# Patient Record
Sex: Male | Born: 1955 | Race: White | Hispanic: No | Marital: Married | Smoking: Never smoker
Health system: Southern US, Community
[De-identification: ages and names within clinical notes are randomized; demographics above are authoritative.]

## PROBLEM LIST (undated history)

## (undated) DIAGNOSIS — I10 Essential (primary) hypertension: Secondary | ICD-10-CM

## (undated) HISTORY — PX: EYE SURGERY: SHX253

## (undated) HISTORY — PX: CARDIAC SURGERY: SHX584

## (undated) HISTORY — PX: HERNIA REPAIR: SHX51

## (undated) HISTORY — PX: KNEE SURGERY: SHX244

---

## 2009-07-29 ENCOUNTER — Ambulatory Visit (HOSPITAL_COMMUNITY): Admission: RE | Admit: 2009-07-29 | Discharge: 2009-07-30 | Payer: Self-pay | Admitting: Neurosurgery

## 2010-06-07 ENCOUNTER — Other Ambulatory Visit: Payer: Self-pay | Admitting: Neurosurgery

## 2010-06-07 DIAGNOSIS — M542 Cervicalgia: Secondary | ICD-10-CM

## 2010-06-10 ENCOUNTER — Ambulatory Visit
Admission: RE | Admit: 2010-06-10 | Discharge: 2010-06-10 | Disposition: A | Discharge status: Home / Self Care | Payer: Worker's Compensation | Source: Ambulatory Visit | Attending: Neurosurgery | Admitting: Neurosurgery

## 2010-06-10 DIAGNOSIS — M542 Cervicalgia: Secondary | ICD-10-CM

## 2010-06-21 LAB — CBC
HCT: 41.5 % (ref 39.0–52.0)
Hemoglobin: 14.6 g/dL (ref 13.0–17.0)
MCHC: 35 g/dL (ref 30.0–36.0)
MCV: 93.3 fL (ref 78.0–100.0)
Platelets: 260 10*3/uL (ref 150–400)
RBC: 4.45 MIL/uL (ref 4.22–5.81)
RDW: 12.6 % (ref 11.5–15.5)
WBC: 5.9 10*3/uL (ref 4.0–10.5)

## 2010-06-21 LAB — BASIC METABOLIC PANEL
BUN: 12 mg/dL (ref 6–23)
CO2: 31 mEq/L (ref 19–32)
Calcium: 9.4 mg/dL (ref 8.4–10.5)
Chloride: 105 mEq/L (ref 96–112)
Creatinine, Ser: 0.93 mg/dL (ref 0.4–1.5)
GFR calc Af Amer: >60 mL/min (ref >60)
GFR calc non Af Amer: >60 mL/min (ref >60)
Glucose, Bld: 90 mg/dL (ref 70–99)
Potassium: 4.3 mEq/L (ref 3.5–5.1)
Sodium: 141 mEq/L (ref 135–145)

## 2010-06-21 LAB — SURGICAL PCR SCREEN
MRSA, PCR: NEGATIVE
Staphylococcus aureus: NEGATIVE

## 2010-08-10 ENCOUNTER — Ambulatory Visit (HOSPITAL_COMMUNITY)
Admission: RE | Admit: 2010-08-10 | Discharge: 2010-08-10 | Disposition: A | Discharge status: Home / Self Care | Payer: Worker's Compensation | Source: Ambulatory Visit | Attending: Neurosurgery | Admitting: Neurosurgery

## 2010-08-10 ENCOUNTER — Other Ambulatory Visit (HOSPITAL_COMMUNITY): Payer: Self-pay | Admitting: Neurosurgery

## 2010-08-10 ENCOUNTER — Encounter (HOSPITAL_COMMUNITY)
Admission: RE | Admit: 2010-08-10 | Discharge: 2010-08-10 | Disposition: A | Discharge status: Home / Self Care | Payer: Worker's Compensation | Source: Ambulatory Visit | Attending: Neurosurgery | Admitting: Neurosurgery

## 2010-08-10 DIAGNOSIS — Z01810 Encounter for preprocedural cardiovascular examination: Secondary | ICD-10-CM | POA: Insufficient documentation

## 2010-08-10 DIAGNOSIS — Z01812 Encounter for preprocedural laboratory examination: Secondary | ICD-10-CM | POA: Insufficient documentation

## 2010-08-10 DIAGNOSIS — Z01818 Encounter for other preprocedural examination: Secondary | ICD-10-CM | POA: Insufficient documentation

## 2010-08-10 DIAGNOSIS — M5412 Radiculopathy, cervical region: Secondary | ICD-10-CM

## 2010-08-10 DIAGNOSIS — I10 Essential (primary) hypertension: Secondary | ICD-10-CM | POA: Insufficient documentation

## 2010-08-10 LAB — CBC
HCT: 41 % (ref 39.0–52.0)
Hemoglobin: 14.4 g/dL (ref 13.0–17.0)
MCH: 30.1 pg (ref 26.0–34.0)
MCHC: 35.1 g/dL (ref 30.0–36.0)
MCV: 85.8 fL (ref 78.0–100.0)
Platelets: 261 10*3/uL (ref 150–400)
RBC: 4.78 MIL/uL (ref 4.22–5.81)
RDW: 12.2 % (ref 11.5–15.5)
WBC: 6.4 10*3/uL (ref 4.0–10.5)

## 2010-08-10 LAB — BASIC METABOLIC PANEL
BUN: 15 mg/dL (ref 6–23)
CO2: 29 mEq/L (ref 19–32)
Calcium: 10.1 mg/dL (ref 8.4–10.5)
Chloride: 102 mEq/L (ref 96–112)
Creatinine, Ser: 1.09 mg/dL (ref 0.4–1.5)
GFR calc Af Amer: >60 mL/min (ref >60)
GFR calc non Af Amer: >60 mL/min (ref >60)
Glucose, Bld: 91 mg/dL (ref 70–99)
Potassium: 5 mEq/L (ref 3.5–5.1)
Sodium: 138 mEq/L (ref 135–145)

## 2010-08-10 LAB — TYPE AND SCREEN
ABO/RH(D): A POS
Antibody Screen: NEGATIVE

## 2010-08-10 LAB — SURGICAL PCR SCREEN
MRSA, PCR: NEGATIVE
Staphylococcus aureus: NEGATIVE

## 2010-08-10 LAB — ABO/RH: ABO/RH(D): A POS

## 2010-08-17 ENCOUNTER — Inpatient Hospital Stay (HOSPITAL_COMMUNITY)
Admission: RE | Admit: 2010-08-17 | Discharge: 2010-08-19 | DRG: 909 | Disposition: A | Discharge status: Home / Self Care | Payer: Worker's Compensation | Source: Ambulatory Visit | Attending: Neurosurgery | Admitting: Neurosurgery

## 2010-08-17 ENCOUNTER — Inpatient Hospital Stay (HOSPITAL_COMMUNITY): Payer: Worker's Compensation

## 2010-08-17 DIAGNOSIS — Z981 Arthrodesis status: Secondary | ICD-10-CM

## 2010-08-17 DIAGNOSIS — IMO0002 Reserved for concepts with insufficient information to code with codable children: Principal | ICD-10-CM | POA: Diagnosis present

## 2010-08-17 DIAGNOSIS — N9989 Other postprocedural complications and disorders of genitourinary system: Secondary | ICD-10-CM | POA: Diagnosis not present

## 2010-08-17 DIAGNOSIS — R339 Retention of urine, unspecified: Secondary | ICD-10-CM | POA: Diagnosis not present

## 2010-08-17 DIAGNOSIS — Y838 Other surgical procedures as the cause of abnormal reaction of the patient, or of later complication, without mention of misadventure at the time of the procedure: Secondary | ICD-10-CM | POA: Diagnosis present

## 2010-08-18 LAB — URINALYSIS, MICROSCOPIC ONLY
Bilirubin Urine: NEGATIVE
Glucose, UA: NEGATIVE mg/dL
Hgb urine dipstick: NEGATIVE
Ketones, ur: NEGATIVE mg/dL
Leukocytes, UA: NEGATIVE
Nitrite: NEGATIVE
Protein, ur: NEGATIVE mg/dL
Specific Gravity, Urine: 1.008 (ref 1.005–1.030)
Urobilinogen, UA: 0.2 mg/dL (ref 0.0–1.0)
pH: 6.5 (ref 5.0–8.0)

## 2010-08-19 LAB — URINE CULTURE
Colony Count: NO GROWTH
Culture  Setup Time: 201205171545
Culture: NO GROWTH

## 2010-08-19 NOTE — Op Note (Signed)
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  PROCEDURE:  C5-C7 posterior cervical arthrodesis with Viewpoint lateral mass screws and rod and Vitoss and Infuse.  SURGEON:  Hewitt Shorts, MD  ASSISTANT:  Cristi Loron, MD  ANESTHESIA:  General endotracheal.  INDICATIONS:  The patient is a 55 year old man who is 1 year status post a two-level C5-6 and C6-7 ACDF.  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  He was treated with external bone growth stimulator and Miacalcin nasal spray without evidence of healing, and therefore, decision was made to proceed with posterior cervical arthrodesis.  PROCEDURE:  The patient was brought to the operating room, placed under general endotracheal anesthesia.  The patient was placed in 3-pin [place] head holder and turned to prone position.  The neck and upper back were prepped with Betadine and soap solution and draped in a sterile fashion.  Midline incision was made over the mid to lower cervical spine.  The line of incision was infiltrated with local anesthetic with epinephrine.  Dissection was carried down to the cervical fascia which was incised bilaterally and the paracervical musculature was dissected from the spinous process and lamina in a subperiosteal fashion.  An x-ray was taken.  We localized the C5, C6, and C7 spinous process and lamina and because his shoulders were not feasible to use C-arm fluoroscopy for visualization and therefore,  screw placement was done under direct visualization.  Entry points were identified on the lateral masses bilaterally at each level.  A pilot hole was made at each location and then a drill hole was made in each lateral mass with  inferomedial to the superolateral trajectory.  Each was examined the ball probe, good bony surfaces were noted and then the posterior cortex was tapped and then we placed a 3.5-mm screws bilaterally at each level using 14-mm screws bilaterally at C5.  A 14-mm screw on the right and a 12-mm screw on the left at C6 and a 14-mm screws bilaterally at C7.  Once all six screws were in place, the rod length was measured.  We wanted a 45-mm length rod.  We cut a 120-mm rods down to 45-mm rods.  They were gently lordosed with a [place] bender and then the rods were placed within the screw heads.  Locking caps were placed and then final tightening was done of all six locking caps against a counter torque.  We decorticated the lamina and spinous process of C5, C6, and C7.  We laid a small layer of Infuse and then a Vitoss over the spinous process and lamina and was compressed against the bony surface and then we proceeded with closure.  Deep fascia was approximated  with interrupted undyed 0 Vicryl sutures.  Scarpa fascia was closed with interrupted undyed 0 Vicryl sutures.  Subcutaneous and subcuticular were closed with inverted 2-0 Vicryl suture, and the skin was approximated with Dermabond.  The procedure was tolerated well.  Estimated blood loss was 50 mL.  Sponge count correct. Following surgery, the patient was turned back to supine position and a 3-pin [place] head holder was removed.  He is to be reversed from the anesthetic, extubated, and transferred to recovery room for further care.  He is to be placed in a soft cervical collar.     Hewitt Shorts, M.D.     RWN/MEDQ  D:  08/17/2010  T:  08/18/2010  Job:  [id number]  Electronically Signed by Shirlean Kelly M.D. on 08/19/2010 11:09:41 AM

## 2011-06-02 ENCOUNTER — Other Ambulatory Visit: Payer: Self-pay | Admitting: Neurosurgery

## 2011-06-02 DIAGNOSIS — M542 Cervicalgia: Secondary | ICD-10-CM

## 2011-06-09 ENCOUNTER — Ambulatory Visit
Admission: RE | Admit: 2011-06-09 | Discharge: 2011-06-09 | Disposition: A | Discharge status: Home / Self Care | Payer: Worker's Compensation | Source: Ambulatory Visit | Attending: Neurosurgery | Admitting: Neurosurgery

## 2011-06-09 ENCOUNTER — Other Ambulatory Visit: Payer: Self-pay

## 2011-06-09 DIAGNOSIS — M542 Cervicalgia: Secondary | ICD-10-CM

## 2012-05-11 IMAGING — CR DG CHEST 2V
2 series · 2 of 2 positions shown · non-contrast
Comparison: 07/23/2009 radiograph and 06/10/2010 cervical spine CT

CLINICAL DATA: Cervical radiculopathy hypertension, cardiac
disease, nonsmoker, preop

CHEST - 2 VIEW

[view not recorded (1 of 2)]
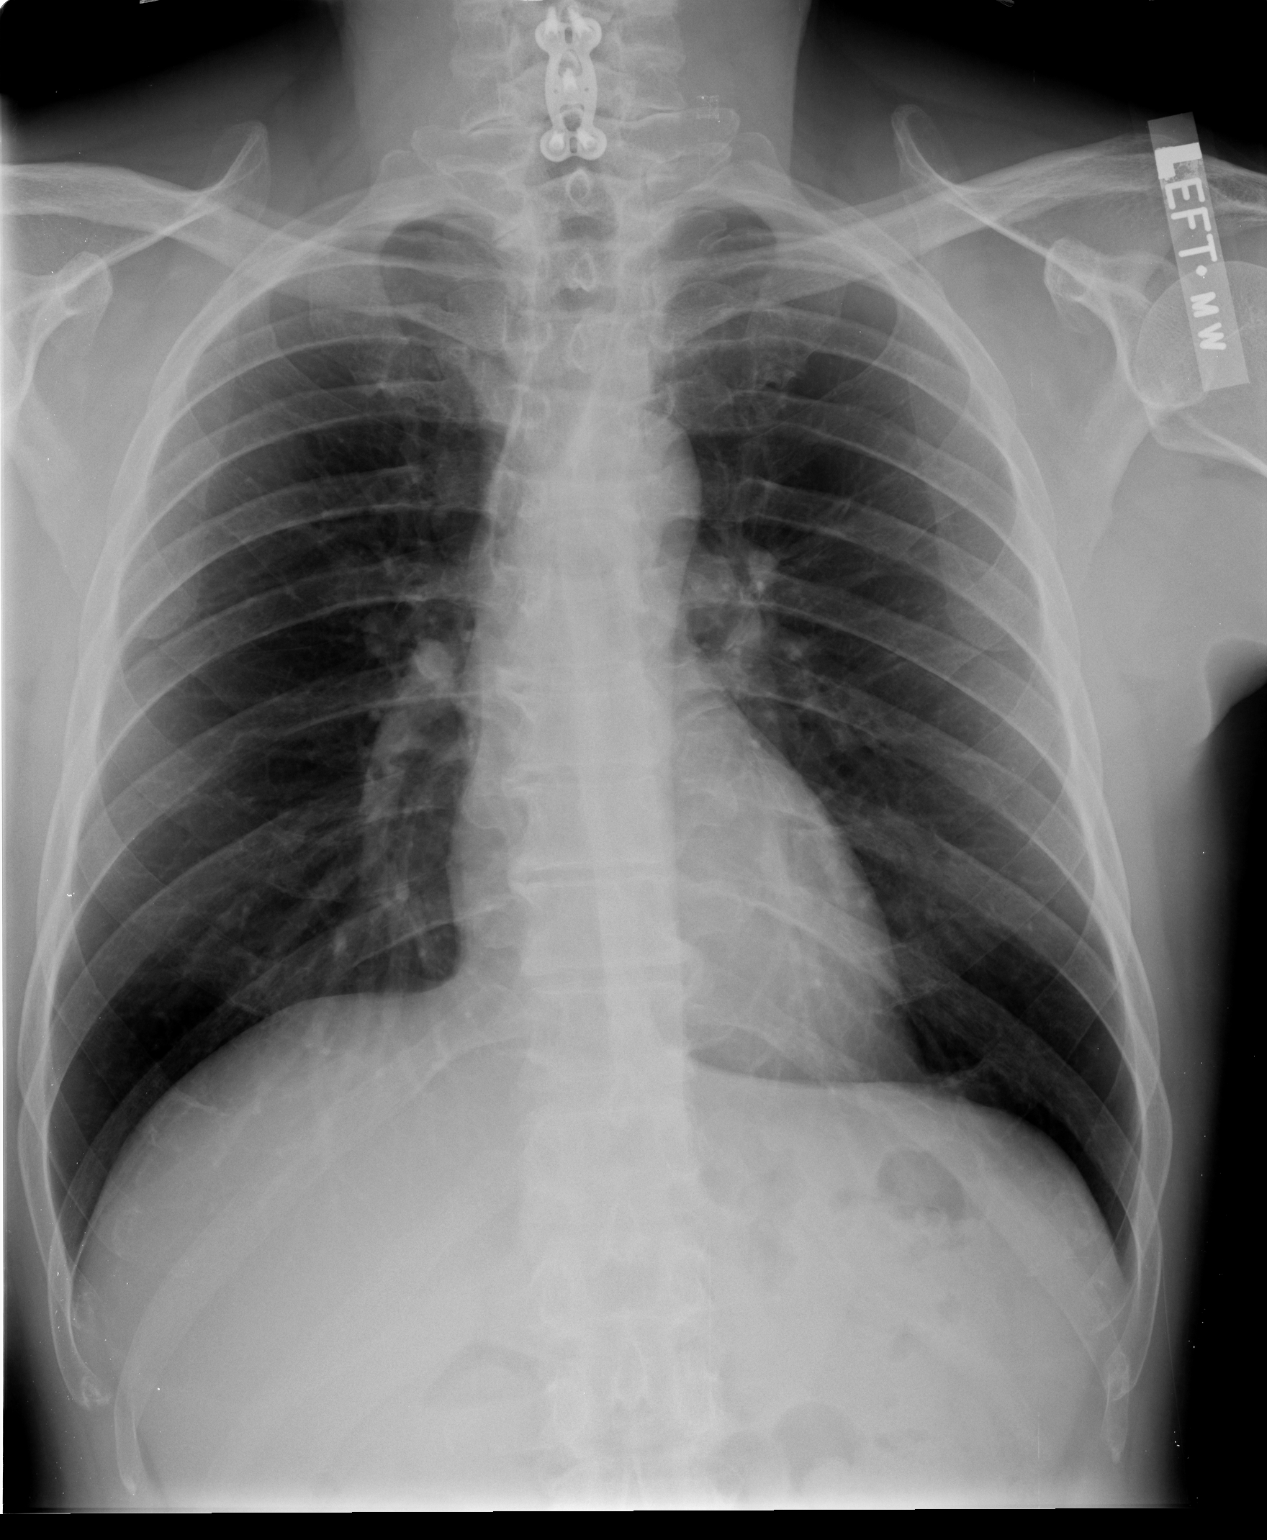

[view not recorded (2 of 2)]
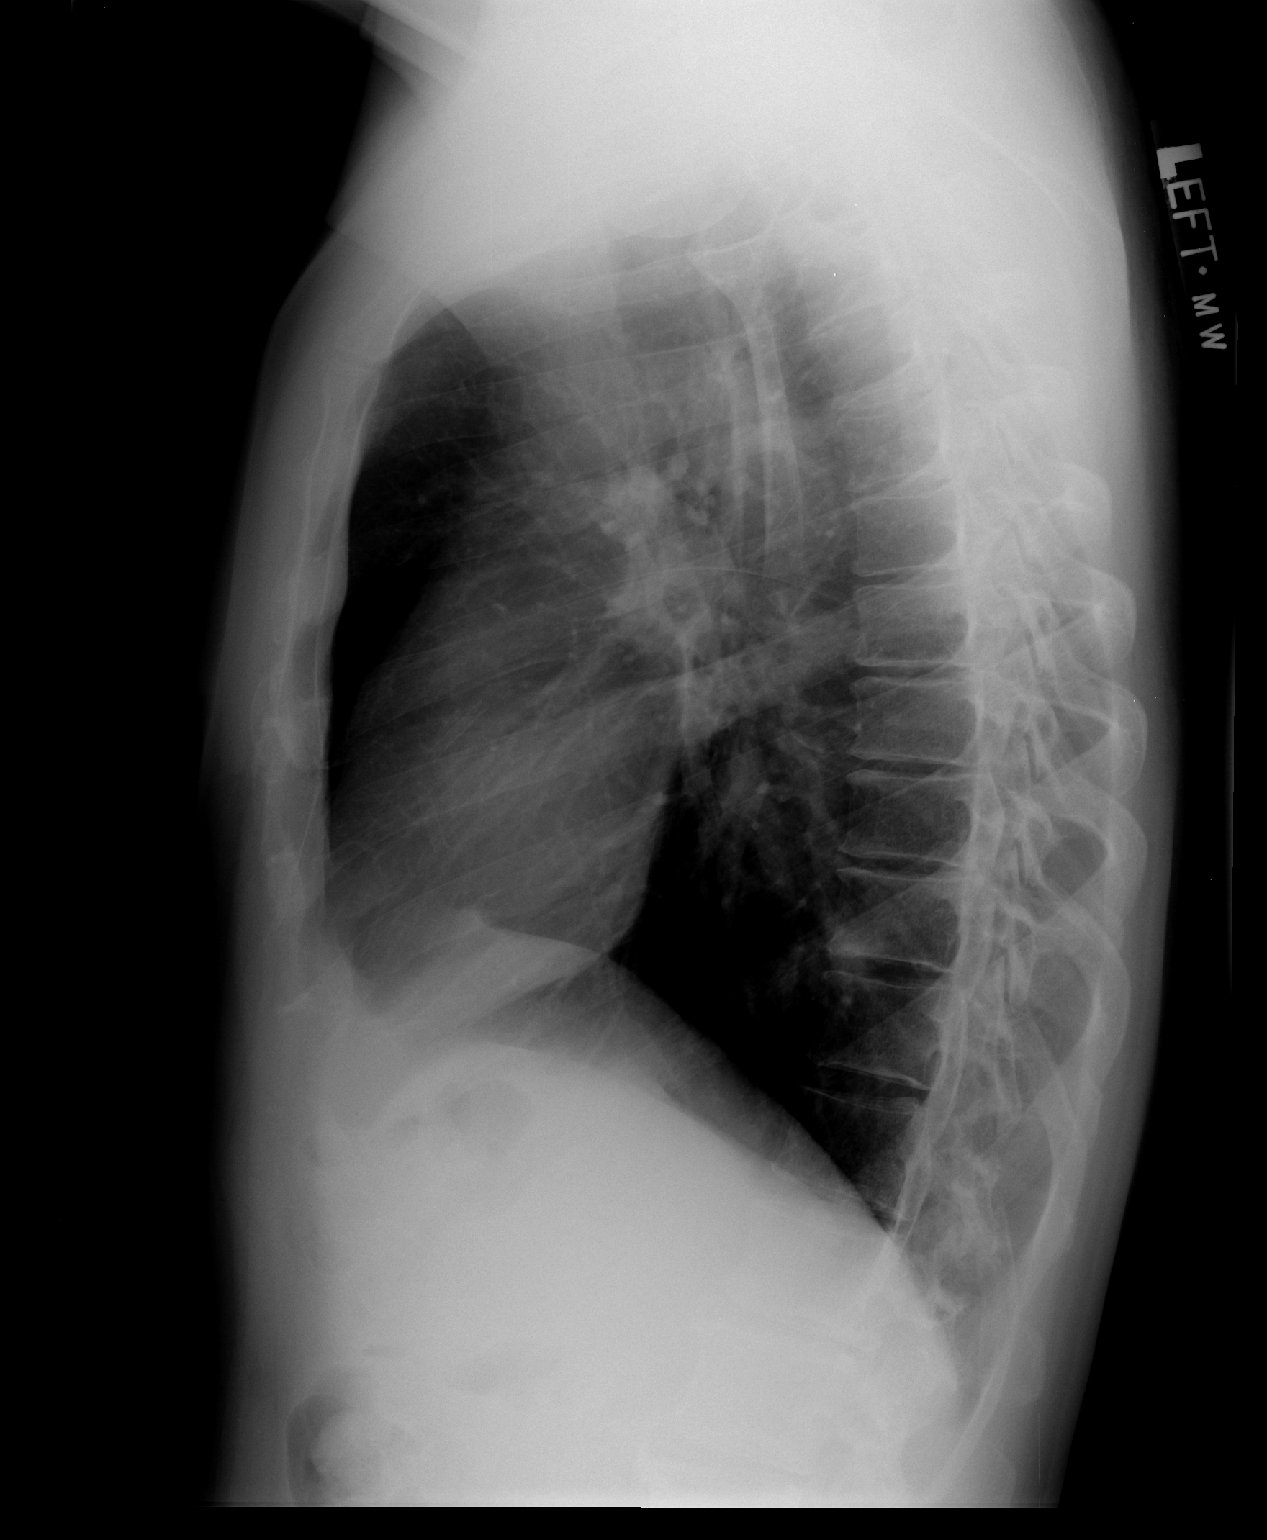

[2 of 2 positions shown; findings below may reference images not displayed]

FINDINGS: The lungs are clear.  No focal consolidation, pleural
effusion, or pneumothorax.  The cardiomediastinal contours are
within normal limits. Partially imaged C5-C7 cervical fusion
hardware.  Bones and the visualized soft tissues otherwise
unremarkable.
IMPRESSION: No acute cardiopulmonary process.

## 2012-05-18 IMAGING — CR DG CERVICAL SPINE 2 OR 3 VIEWS
1 series · 1 of 1 positions shown · non-contrast
Comparison: CT cervical spine 06/10/2010.

CLINICAL DATA: Neck pain

CERVICAL SPINE - 2-3 VIEW

[view not recorded]
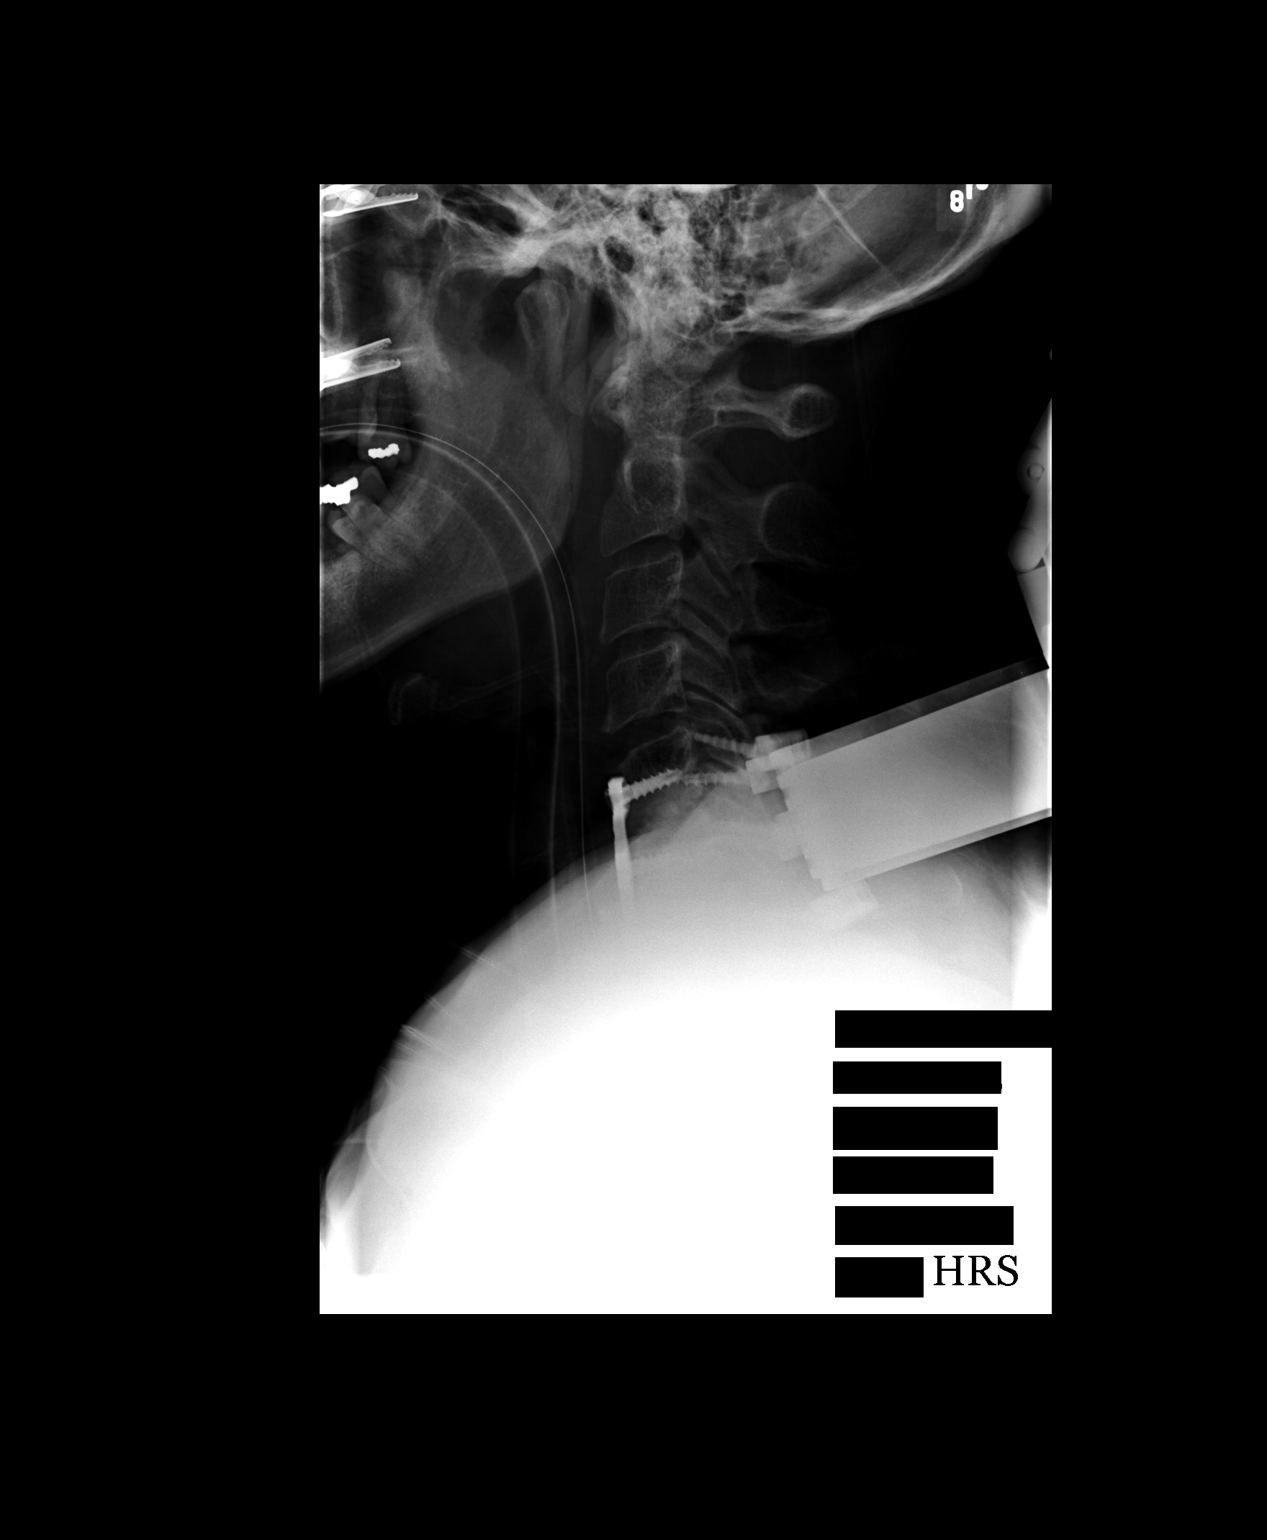

[1 of 1 positions shown; findings below may reference images not displayed]

FINDINGS: Film #1 demonstrates  clamps on the spinous processes of
C6 and C7.  Film #2 demonstrates C5-C7 posterior arthrodesis.
IMPRESSION: As above.

## 2013-03-10 IMAGING — CT CT CERVICAL SPINE W/O CM
3 of 7 series · 7 of 29 positions shown, 8 images · non-contrast
Comparison: 06/08/2011.

CLINICAL DATA: Neck pain.  Fall 1949.  Anterior and posterior
fusion.  Bilateral finger parasthesias.

CT CERVICAL SPINE WITHOUT CONTRAST
TECHNIQUE: Multidetector CT imaging of the cervical spine was
performed. Multiplanar CT image reconstructions were also
generated.

[Series 2: c spine bone · axial · 0.23mm/px · z∈[-168,-108]mm · 2 of 73 slices shown, 3 images]
[im 25/73  soft-tissue]
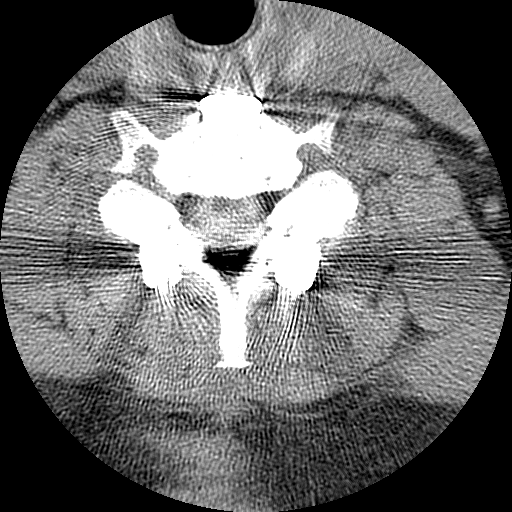
[im 25/73  bone]
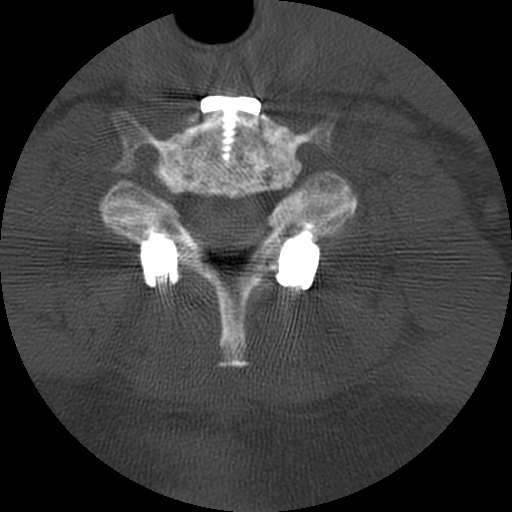
[im 49/73  bone]
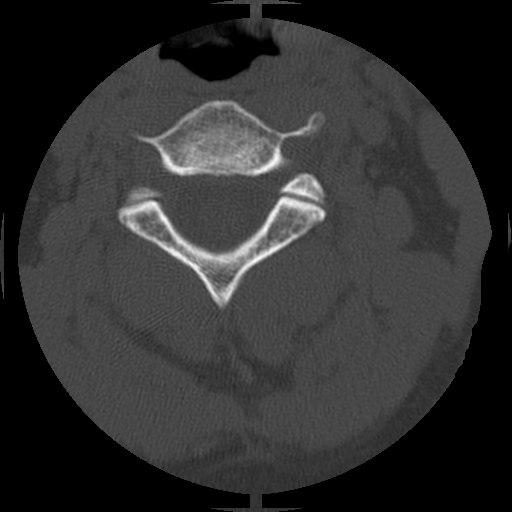

[Series 3: c spine soft · axial · 0.23mm/px · z∈[-168,-108]mm · 2 of 73 slices shown]
[im 25/73  soft-tissue]
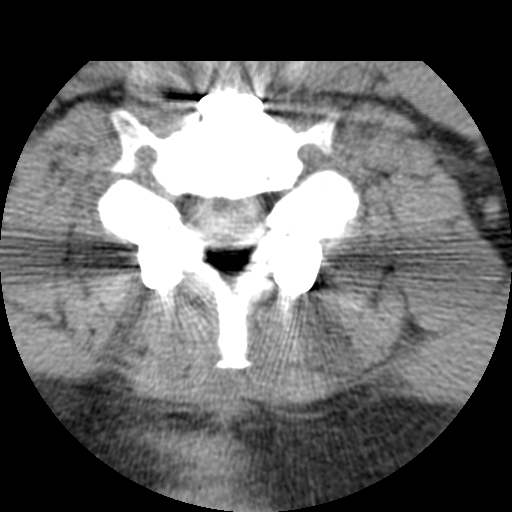
[im 49/73  soft-tissue]
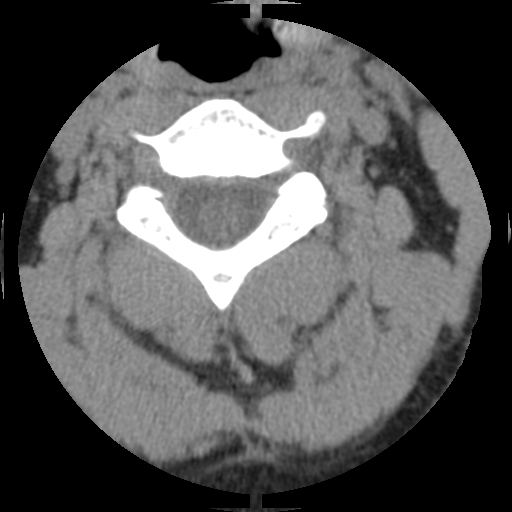

[Series 402: sag · sagittal · 0.36mm/px · 3 of 43 slices shown]
[im 11/43  bone]
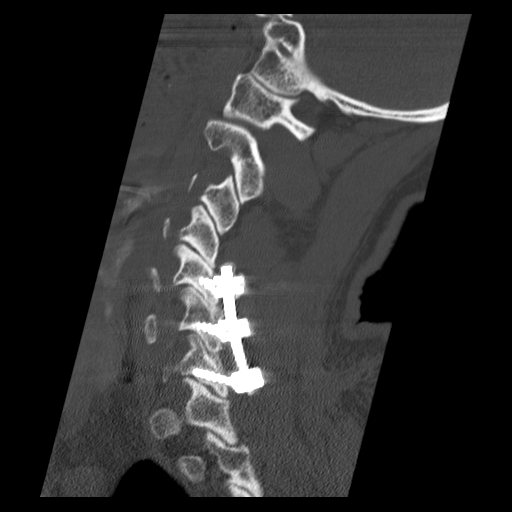
[im 22/43  bone]
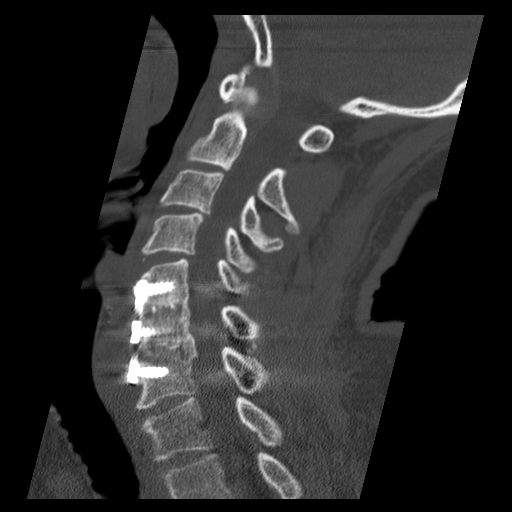
[im 32/43  bone]
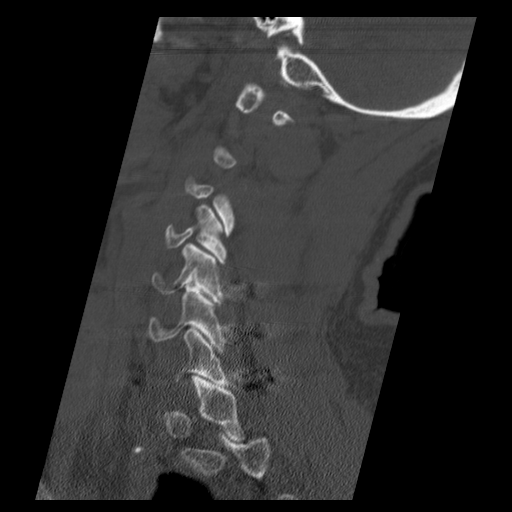

[7 of 29 positions shown; findings below may reference images not displayed]

FINDINGS: Cervical spinal alignment is anatomic.  Severe
atlantodental degenerative disease.  Craniocervical alignment
appears normal with mild left atlanto-occipital degenerative
changes.  Calcification is present in the posterior soft tissues
superficially.  ACDF is present from T5-C7.  Posterior rod and
screw fixation also present from C5-C7.  Solid fusion is present
from C5-C7.

C2-C3:  Negative.

C3-C4:  Left eccentric endplate osteophytes are present.  Left
foraminal encroachment associated with uncovertebral spurring
potentially affects the left C4 nerve.  Right foramen appears
patent. Broad-based partially calcified disc protrusion with
degenerated disc and loss of height.

C4-C5:  Mild bilateral adjacent segment facet arthrosis.  Broad-
based posterior disc bulge.  Mild endplate spurring.  Minimal
symmetric bilateral foraminal encroachment.

C5-C7:  Solid fusion.  Solid fusion.  No C5-C6 stenosis.  C6-C7
residual uncovertebral spurring is present with mild bilateral
foraminal encroachment.

C7-T1:  Degenerated disc with mild posterior C7 endplate spurring.
Right foraminal encroachment potentially affects the right C8
nerve.
IMPRESSION: 1.  Solid C5-C7 fusion.  Residual uncovertebral spurring at C6-C7
with bilateral foraminal encroachment.
2.  C3-C4 degenerated disc with broad-based partially calcified
disc protrusion.  Left foraminal encroachment potentially affecting
the left C4 nerve.
3.  C4-C5 adjacent segment facet arthrosis.  Minimal symmetric
bilateral foraminal encroachment.
4.  Right C7-T1 foraminal encroachment associated uncovertebral
spurring and facet arthrosis potentially affecting the exiting
right C8 nerve.

## 2021-02-04 ENCOUNTER — Other Ambulatory Visit: Payer: Self-pay

## 2021-02-04 ENCOUNTER — Ambulatory Visit
Admission: EM | Admit: 2021-02-04 | Discharge: 2021-02-04 | Disposition: A | Discharge status: Home / Self Care | Payer: Medicare Other | Attending: Medical Oncology | Admitting: Medical Oncology

## 2021-02-04 ENCOUNTER — Encounter: Payer: Self-pay | Admitting: Emergency Medicine

## 2021-02-04 DIAGNOSIS — J069 Acute upper respiratory infection, unspecified: Secondary | ICD-10-CM

## 2021-02-04 DIAGNOSIS — R059 Cough, unspecified: Secondary | ICD-10-CM | POA: Diagnosis present

## 2021-02-04 DIAGNOSIS — U071 COVID-19: Secondary | ICD-10-CM | POA: Insufficient documentation

## 2021-02-04 HISTORY — DX: Essential (primary) hypertension: I10

## 2021-02-04 LAB — RAPID INFLUENZA A&B ANTIGENS
Influenza A (ARMC): NEGATIVE
Influenza B (ARMC): NEGATIVE

## 2021-02-04 MED ORDER — FLUTICASONE PROPIONATE 50 MCG/ACT NA SUSP: 2.0000 | Freq: Every day | NASAL | 0 refills | Status: AC

## 2021-02-04 MED ORDER — BENZONATATE 100 MG PO CAPS: 100.0000 mg | ORAL_CAPSULE | Freq: Three times a day (TID) | ORAL | 0 refills | Status: AC

## 2021-02-04 NOTE — ED Provider Notes (Signed)
[facility]    CSN: [id number] Arrival date & time: 02/04/21  1754      History   Chief Complaint Chief Complaint  Patient presents with   Cough   Generalized Body Aches   Headache    HPI [name] is a 65 y.o. male.   HPI  Cough: Patient reports with his wife.  They state that since yesterday patient has had chills, dry cough, runny nose, nose congestion, body aches and headache.  He has felt feverish at times but this is subjective.  He denies any shortness of breath, hemoptysis.  He reports that he had a bit of chest discomfort starting yesterday however he was seen by his cardiologist today and had an EKG and everything was normal.  This is not worsened or changes.  He has tried Tylenol for symptoms with some improvement.  He states that his cough is is worse symptom.  Past Medical History:  Diagnosis Date   Hypertension     There are no problems to display for this patient.   Past Surgical History:  Procedure Laterality Date   CARDIAC SURGERY     EYE SURGERY     HERNIA REPAIR     KNEE SURGERY Right        Home Medications    Prior to Admission medications   Medication Sig Start Date End Date Taking? Authorizing Provider  aspirin 81 MG EC tablet Take by mouth. 03/01/20  Yes [provider]  metoprolol succinate (TOPROL-XL) 25 MG 24 hr tablet Take 0.5 tablets by mouth daily. 08/04/20  Yes [provider]  omeprazole (PRILOSEC) 20 MG capsule Take 1 capsule by mouth daily. 09/27/20  Yes [provider]  rosuvastatin (CRESTOR) 40 MG tablet Take by mouth. 09/30/20  Yes [provider]  traZODone (DESYREL) 50 MG tablet Take 100 mg by mouth at bedtime as needed. 12/28/20  Yes [provider]    Family History History reviewed. No pertinent family history.  Social History Social History   Tobacco Use   Smoking status: Never   Smokeless tobacco: Never  Vaping Use   Vaping Use: Never used  Substance  Use Topics   Alcohol use: Yes   Drug use: Never     Allergies   Patient has no known allergies.   Review of Systems Review of Systems  As stated above in HPI Physical Exam Triage Vital Signs ED Triage Vitals [02/04/21 1829]  Enc Vitals Group     BP      Pulse      Resp      Temp      Temp src      SpO2      Weight 178 lb (80.7 kg)     Height 5\' 7"  (1.702 m)     Head Circumference      Peak Flow      Pain Score 6     Pain Loc      Pain Edu?      Excl. in [place]?    No data found.  Updated Vital Signs Ht 5\' 7"  (1.702 m)   Wt 178 lb (80.7 kg)   BMI 27.88 kg/m   Physical Exam Vitals and nursing note reviewed.  Constitutional:      General: He is not in acute distress.    Appearance: He is well-developed. He is not ill-appearing, toxic-appearing or diaphoretic.  HENT:     Head: Normocephalic and atraumatic.  Right Ear: Tympanic membrane normal.     Left Ear: Tympanic membrane normal.     Nose: Congestion and rhinorrhea present.     Mouth/Throat:     Mouth: Mucous membranes are moist.     Pharynx: Oropharynx is clear.  Eyes:     Extraocular Movements: Extraocular movements intact.     Pupils: Pupils are equal, round, and reactive to light.  Cardiovascular:     Rate and Rhythm: Normal rate and regular rhythm.     Heart sounds: Normal heart sounds.  Pulmonary:     Effort: Pulmonary effort is normal.     Breath sounds: Normal breath sounds.  Abdominal:     General: Bowel sounds are normal.  Musculoskeletal:        General: Normal range of motion.     Cervical back: Normal range of motion and neck supple.  Lymphadenopathy:     Cervical: No cervical adenopathy.  Skin:    General: Skin is warm.     Coloration: Skin is not cyanotic.  Neurological:     Mental Status: He is alert and oriented to person, place, and time.     UC Treatments / Results  Labs (all labs ordered are listed, but only abnormal results are displayed) Labs Reviewed  RAPID  INFLUENZA A&B ANTIGENS  SARS CORONAVIRUS 2 (TAT 6-24 HRS)    EKG   Radiology No results found.  Procedures Procedures (including critical care time)  Medications Ordered in UC Medications - No data to display  Initial Impression / Assessment and Plan / UC Course  I have reviewed the triage vital signs and the nursing notes.  Pertinent labs & imaging results that were available during my care of the patient were reviewed by me and considered in my medical decision making (see chart for details).     New.  Appears to be a viral URI.  Treating with Tessalon and Flonase.  Discussed red flag signs and symptoms along with recommendation for rest and hydration with water.  Follow-up as needed. Final Clinical Impressions(s) / UC Diagnoses   Final diagnoses:  None   Discharge Instructions   None    ED Prescriptions   None    PDMP not reviewed this encounter.   Rushie Chestnut, [place] 02/04/21 1941

## 2021-02-04 NOTE — ED Triage Notes (Signed)
Patient c/o chills, cough, runny nose, bodyaches and headaches that started yesterday.  Patient unsure of fevers.

## 2021-02-05 ENCOUNTER — Telehealth: Payer: Self-pay | Admitting: Emergency Medicine

## 2021-02-05 LAB — SARS CORONAVIRUS 2 (TAT 6-24 HRS): SARS Coronavirus 2: POSITIVE — AB

## 2021-02-05 MED ORDER — MOLNUPIRAVIR EUA 200MG CAPSULE: 4.0000 | ORAL_CAPSULE | Freq: Two times a day (BID) | ORAL | 0 refills | Status: AC

## 2021-02-05 NOTE — Telephone Encounter (Signed)
Patient is positive for COVID-19.  Last CMP is from May 2021 and is out of date.  Patient has comorbidities of age, high cholesterol, and hypertension.  We will send mono.  We are antiviral therapy to the pharmacy.

## 2021-05-23 ENCOUNTER — Other Ambulatory Visit: Payer: Self-pay

## 2021-05-23 ENCOUNTER — Ambulatory Visit
Admission: EM | Admit: 2021-05-23 | Discharge: 2021-05-23 | Disposition: A | Discharge status: Home / Self Care | Payer: Medicare Other | Attending: Emergency Medicine | Admitting: Emergency Medicine

## 2021-05-23 DIAGNOSIS — R112 Nausea with vomiting, unspecified: Secondary | ICD-10-CM | POA: Diagnosis not present

## 2021-05-23 DIAGNOSIS — R197 Diarrhea, unspecified: Secondary | ICD-10-CM | POA: Diagnosis not present

## 2021-05-23 LAB — RAPID INFLUENZA A&B ANTIGENS
Influenza A (ARMC): NEGATIVE
Influenza B (ARMC): NEGATIVE

## 2021-05-23 MED ORDER — ONDANSETRON 8 MG PO TBDP
8.0000 mg | ORAL_TABLET | Freq: Three times a day (TID) | ORAL | 0 refills | Status: AC | PRN
Start: 2021-05-23 — End: ?

## 2021-05-23 MED ORDER — ONDANSETRON 8 MG PO TBDP
8.0000 mg | ORAL_TABLET | Freq: Once | ORAL | Status: AC
  Administered 2021-05-23: 8 mg via ORAL

## 2021-05-23 NOTE — ED Triage Notes (Signed)
Pt here with C/O vomiting and diarrhea and body aches for 2 days. Denies fever or other SX. Last vomit at 8am. 1 last night and 2 this morning.

## 2021-05-23 NOTE — Discharge Instructions (Addendum)
Take the Zofran every 8 hours as needed for nausea and vomiting.  They are an oral disintegrating tablet and you can place them on her under your tongue and then will be absorbed.  Follow a clear liquid diet for the next 6 to 12 hours.  Clear liquids consist of broth, ginger ale, water, Pedialyte, and Jell-O.  After 6 to 12 hours, if you are tolerating clear liquids, you can advance to bland foods such as bananas, rice, applesauce, and toast.  If you tolerate bland foods you can continue to advance your diet as you see fit.  You can also use over-the-counter Pepto-Bismol or Kaopectate to manage her diarrhea stool symptoms.  If you develop a fever over 100.5, increased abdominal pain, bloody vomit, or bloody stool return for reevaluation or go to the ER.

## 2021-05-23 NOTE — ED Provider Notes (Signed)
[facility]    CSN: [id number] Arrival date & time: 05/23/21  0806      History   Chief Complaint Chief Complaint  Patient presents with   Emesis   Diarrhea    HPI [name] is a 66 y.o. male.   HPI  66 year old male here for evaluation of GI complaints.  Patient reports that for the last 2 days he has been experiencing vomiting and diarrhea.  He has had a total of 4 episodes of vomiting and too numerous to count episodes of diarrhea.  He denies any blood in his emesis or stool.  He states he tried to drink a protein shake this morning and vomited it back up.  He denies being able to keep down any fluids.  His symptoms are also associated with body aches.  He does have a nonproductive cough as well.  He denies any fever, runny nose nasal congestion, shortness of breath or wheezing.  His spouse has similar symptoms.  He reports that both her symptoms started the same time and it was after eating at a barbecue restaurant in [place].  They have both eaten here before without any issues.  She does endorse having abdominal pain last night but denies any pain this morning.  Past Medical History:  Diagnosis Date   Hypertension     There are no problems to display for this patient.   Past Surgical History:  Procedure Laterality Date   CARDIAC SURGERY     EYE SURGERY     HERNIA REPAIR     KNEE SURGERY Right        Home Medications    Prior to Admission medications   Medication Sig Start Date End Date Taking? Authorizing Provider  aspirin 81 MG EC tablet Take by mouth. 03/01/20  Yes [provider]  fluticasone (FLONASE) 50 MCG/ACT nasal spray Place 2 sprays into both nostrils daily. 02/04/21  Yes [place], Judson Roch M, PA-C  metoprolol succinate (TOPROL-XL) 25 MG 24 hr tablet Take 0.5 tablets by mouth daily. 08/04/20  Yes [provider]  omeprazole (PRILOSEC) 20 MG capsule Take 1 capsule by mouth daily. 09/27/20  Yes [provider]   ondansetron (ZOFRAN-ODT) 8 MG disintegrating tablet Take 1 tablet (8 mg total) by mouth every 8 (eight) hours as needed for nausea or vomiting. 05/23/21  Yes Margarette Canada, NP  rosuvastatin (CRESTOR) 40 MG tablet Take by mouth. 09/30/20  Yes [provider]  traZODone (DESYREL) 50 MG tablet Take 100 mg by mouth at bedtime as needed. 12/28/20  Yes [provider]  benzonatate (TESSALON) 100 MG capsule Take 1 capsule (100 mg total) by mouth every 8 (eight) hours. 02/04/21   Hughie Closs, PA-C    Family History History reviewed. No pertinent family history.  Social History Social History   Tobacco Use   Smoking status: Never   Smokeless tobacco: Never  Vaping Use   Vaping Use: Never used  Substance Use Topics   Alcohol use: Yes   Drug use: Never     Allergies   Patient has no known allergies.   Review of Systems Review of Systems  Constitutional:  Negative for fever.  HENT:  Negative for congestion and rhinorrhea.   Respiratory:  Positive for cough. Negative for shortness of breath and wheezing.   Gastrointestinal:  Positive for abdominal pain, diarrhea, nausea and vomiting. Negative for blood in stool.  Musculoskeletal:  Positive for arthralgias and myalgias.  Skin:  Negative for rash.  Hematological: Negative.   Psychiatric/Behavioral: Negative.      Physical Exam Triage Vital Signs ED Triage Vitals  Enc Vitals Group     BP 05/23/21 0819 (!) 156/85     Pulse Rate 05/23/21 0819 97     Resp 05/23/21 0819 18     Temp 05/23/21 0819 99.7 F (37.6 C)     Temp Source 05/23/21 0819 Oral     SpO2 05/23/21 0819 98 %     Weight 05/23/21 0818 169 lb (76.7 kg)     Height 05/23/21 0818 5\' 7"  (1.702 m)     Head Circumference --      Peak Flow --      Pain Score 05/23/21 0817 0     Pain Loc --      Pain Edu? --      Excl. in [place]? --    No data found.  Updated Vital Signs BP (!) 156/85 (BP Location: Right Arm)    Pulse 97    Temp 99.7 F (37.6 C)  (Oral)    Resp 18    Ht 5\' 7"  (1.702 m)    Wt 169 lb (76.7 kg)    SpO2 98%    BMI 26.47 kg/m   Visual Acuity Right Eye Distance:   Left Eye Distance:   Bilateral Distance:    Right Eye Near:   Left Eye Near:    Bilateral Near:     Physical Exam Vitals and nursing note reviewed.  Constitutional:      Appearance: Normal appearance. He is ill-appearing.  HENT:     Head: Normocephalic and atraumatic.     Right Ear: Tympanic membrane, ear canal and external ear normal. There is no impacted cerumen.     Left Ear: Tympanic membrane, ear canal and external ear normal. There is no impacted cerumen.     Nose: Nose normal. No congestion or rhinorrhea.     Mouth/Throat:     Mouth: Mucous membranes are moist.     Pharynx: Oropharynx is clear. No oropharyngeal exudate or posterior oropharyngeal erythema.  Cardiovascular:     Rate and Rhythm: Normal rate and regular rhythm.     Pulses: Normal pulses.     Heart sounds: Normal heart sounds. No murmur heard.   No friction rub. No gallop.  Pulmonary:     Effort: Pulmonary effort is normal.     Breath sounds: Normal breath sounds. No wheezing, rhonchi or rales.  Abdominal:     General: Abdomen is flat. Bowel sounds are normal.     Palpations: Abdomen is soft.     Tenderness: There is no abdominal tenderness. There is no guarding or rebound.  Musculoskeletal:     Cervical back: Normal range of motion and neck supple.  Lymphadenopathy:     Cervical: No cervical adenopathy.  Skin:    General: Skin is warm and dry.     Capillary Refill: Capillary refill takes less than 2 seconds.     Findings: No erythema or rash.  Neurological:     General: No focal deficit present.     Mental Status: He is alert and oriented to person, place, and time.  Psychiatric:        Mood and Affect: Mood normal.        Behavior: Behavior normal.        Thought Content: Thought content normal.        Judgment: Judgment normal.     UC Treatments / Results  Labs (all labs ordered are listed, but only abnormal results are displayed) Labs Reviewed  RAPID INFLUENZA A&B ANTIGENS    EKG   Radiology No results found.  Procedures Procedures (including critical care time)  Medications Ordered in UC Medications  ondansetron (ZOFRAN-ODT) disintegrating tablet 8 mg (8 mg Oral Given 05/23/21 [id number])    Initial Impression / Assessment and Plan / UC Course  I have reviewed the triage vital signs and the nursing notes.  Pertinent labs & imaging results that were available during my care of the patient were reviewed by me and considered in my medical decision making (see chart for details).  Patient is a pleasant, though mildly ill-appearing, 66 year old male here for evaluation of body aches, vomiting, and diarrhea that began 2 days ago.  He endorses having a mild nonproductive cough as well but denies any other upper respiratory symptoms.  He also denies shortness of breath or wheezing.  He and his wife both developed symptoms at the same time and both of their symptoms started after eating at a barbecue restaurant in [place] 2 days ago.  He has had 4 episodes of emesis with the last episode being at 8 AM this morning and too numerous to count episodes of diarrhea.  No blood in stool or emesis.  Patient denies any fever at home.  Patient is experiencing an elevated temp of 99.7 here in clinic.  His upper respiratory exam is benign.  Cardiopulmonary exam reveals S1-S2 heart sounds that are regular rate and rhythm.  Lung sounds are clear to auscultation all fields.  Abdomen is soft, flat, nontender, with positive bowel sounds in all 4 quadrants.  Differential diagnosis include foodborne illness versus flu versus COVID.  Patient did have COVID 3 months ago so I will not test for COVID at this time.  He is still within the window where it could still be positive from his previous illness.  I will check rapid influenza.  I will also give patient a dose of Zofran  and p.o. challenge here.  Rapid influenza is negative  Will perform fluid challenge to see if patient can keep down fluids.  Patient has tolerated his p.o. challenge.  I will discharge him home with Zofran to help with nausea and have him continue to rehydrate following a clear liquid diet for the next 6 to 12 hours and then advancing to a brat diet as tolerated.  Patient can use over-the-counter Kaopectate or Pepto-Bismol as needed for management of his diarrhea stools.  Return precautions reviewed.   Final Clinical Impressions(s) / UC Diagnoses   Final diagnoses:  Nausea vomiting and diarrhea     Discharge Instructions      Take the Zofran every 8 hours as needed for nausea and vomiting.  They are an oral disintegrating tablet and you can place them on her under your tongue and then will be absorbed.  Follow a clear liquid diet for the next 6 to 12 hours.  Clear liquids consist of broth, ginger ale, water, Pedialyte, and Jell-O.  After 6 to 12 hours, if you are tolerating clear liquids, you can advance to bland foods such as bananas, rice, applesauce, and toast.  If you tolerate bland foods you can continue to advance your diet as you see fit.  You can also use over-the-counter Pepto-Bismol or Kaopectate to manage her diarrhea stool symptoms.  If you develop a fever over 100.5, increased abdominal pain, bloody vomit, or bloody stool return for reevaluation or go to the  ER.      ED Prescriptions     Medication Sig Dispense Auth. Provider   ondansetron (ZOFRAN-ODT) 8 MG disintegrating tablet Take 1 tablet (8 mg total) by mouth every 8 (eight) hours as needed for nausea or vomiting. 20 tablet Margarette Canada, NP      PDMP not reviewed this encounter.   Margarette Canada, NP 05/23/21 [phone]

## 2023-12-13 ENCOUNTER — Ambulatory Visit: Admitting: Physical Therapy

## 2023-12-18 ENCOUNTER — Encounter: Admitting: Physical Therapy

## 2023-12-20 ENCOUNTER — Encounter: Admitting: Physical Therapy

## 2023-12-24 ENCOUNTER — Encounter: Admitting: Physical Therapy

## 2023-12-26 ENCOUNTER — Encounter: Admitting: Physical Therapy

## 2023-12-31 ENCOUNTER — Encounter: Admitting: Physical Therapy

## 2024-01-02 ENCOUNTER — Encounter: Admitting: Physical Therapy

## 2024-01-08 ENCOUNTER — Encounter: Admitting: Physical Therapy

## 2024-01-10 ENCOUNTER — Encounter: Admitting: Physical Therapy

## 2024-01-15 ENCOUNTER — Encounter: Admitting: Physical Therapy

## 2024-01-17 ENCOUNTER — Encounter: Admitting: Physical Therapy

## 2024-01-22 ENCOUNTER — Encounter: Admitting: Physical Therapy

## 2024-01-24 ENCOUNTER — Encounter: Admitting: Physical Therapy

## 2024-01-29 ENCOUNTER — Encounter: Admitting: Physical Therapy

## 2024-01-31 ENCOUNTER — Encounter: Admitting: Physical Therapy
